# Patient Record
Sex: Female | Born: 1975 | Race: White | Hispanic: No | Marital: Married | Smoking: Never smoker
Health system: Southern US, Community
[De-identification: ages and names within clinical notes are randomized; demographics above are authoritative.]

## PROBLEM LIST (undated history)

## (undated) DIAGNOSIS — N200 Calculus of kidney: Secondary | ICD-10-CM

## (undated) HISTORY — PX: WISDOM TOOTH EXTRACTION: SHX21

---

## 2000-09-13 ENCOUNTER — Encounter: Payer: Self-pay | Admitting: Internal Medicine

## 2000-09-13 ENCOUNTER — Emergency Department (HOSPITAL_COMMUNITY): Admission: EM | Admit: 2000-09-13 | Discharge: 2000-09-13 | Payer: Self-pay | Admitting: Internal Medicine

## 2004-06-02 ENCOUNTER — Other Ambulatory Visit: Admission: RE | Admit: 2004-06-02 | Discharge: 2004-06-02 | Payer: Self-pay | Admitting: Obstetrics and Gynecology

## 2004-08-20 ENCOUNTER — Inpatient Hospital Stay (HOSPITAL_COMMUNITY): Admission: AD | Admit: 2004-08-20 | Discharge: 2004-08-20 | Payer: Self-pay | Admitting: Obstetrics and Gynecology

## 2004-09-16 ENCOUNTER — Ambulatory Visit (HOSPITAL_COMMUNITY): Admission: RE | Admit: 2004-09-16 | Discharge: 2004-09-16 | Payer: Self-pay | Admitting: Obstetrics and Gynecology

## 2004-11-04 ENCOUNTER — Inpatient Hospital Stay (HOSPITAL_COMMUNITY): Admission: AD | Admit: 2004-11-04 | Discharge: 2004-11-04 | Payer: Self-pay | Admitting: Obstetrics and Gynecology

## 2005-01-05 ENCOUNTER — Inpatient Hospital Stay (HOSPITAL_COMMUNITY): Admission: AD | Admit: 2005-01-05 | Discharge: 2005-01-05 | Payer: Self-pay | Admitting: Obstetrics and Gynecology

## 2005-01-21 ENCOUNTER — Inpatient Hospital Stay (HOSPITAL_COMMUNITY): Admission: AD | Admit: 2005-01-21 | Discharge: 2005-01-24 | Payer: Self-pay | Admitting: Obstetrics and Gynecology

## 2005-08-04 ENCOUNTER — Encounter: Admission: RE | Admit: 2005-08-04 | Discharge: 2005-08-04 | Payer: Self-pay | Admitting: Sports Medicine

## 2006-04-18 IMAGING — US US OB COMP +14 WK
1 series · 13 of 28 positions shown · non-contrast
Comparison: none

CLINICAL DATA: 21 week 2 day assigned gestational age by prior office ultrasound.  Evaluate fetal anatomy and growth.

[Series 1: us ob comp +14 wk · 0.36mm/px · 13 of 85 slices shown]
[im 4/85]
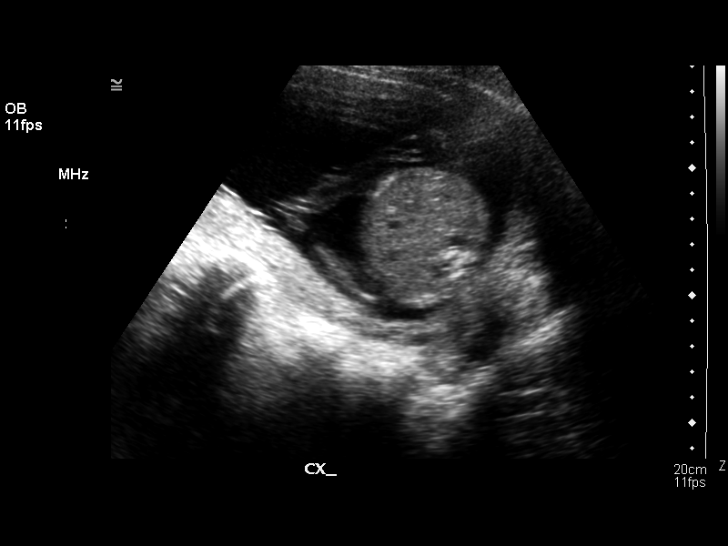
[im 10/85]
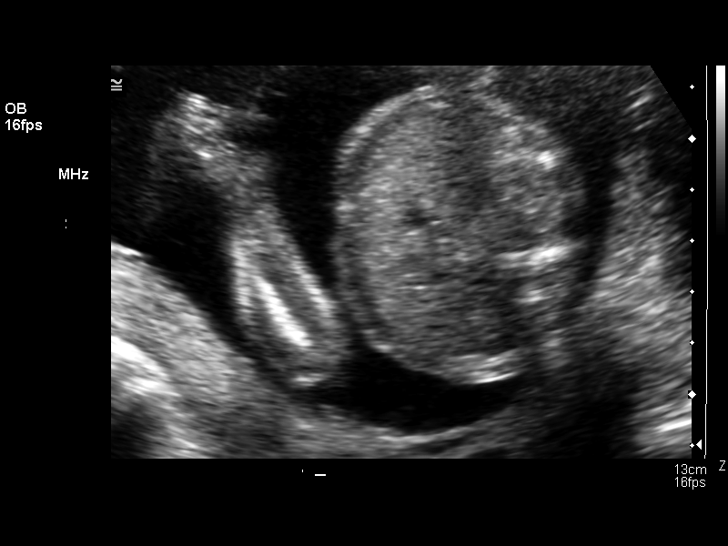
[im 16/85]
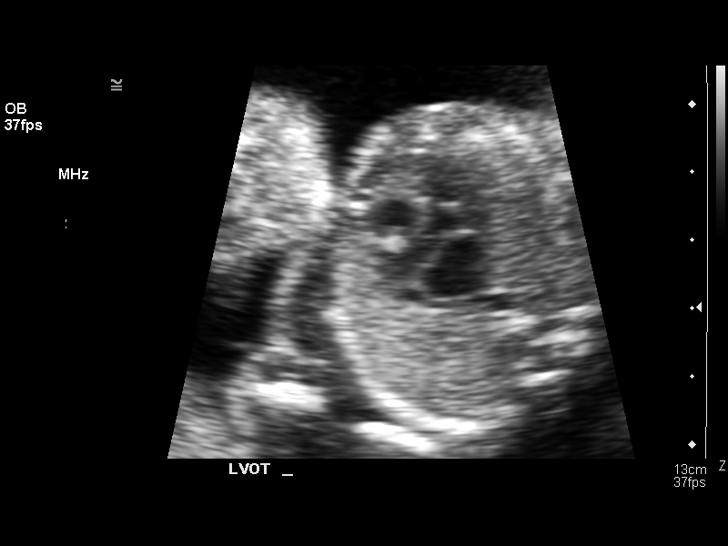
[im 22/85]
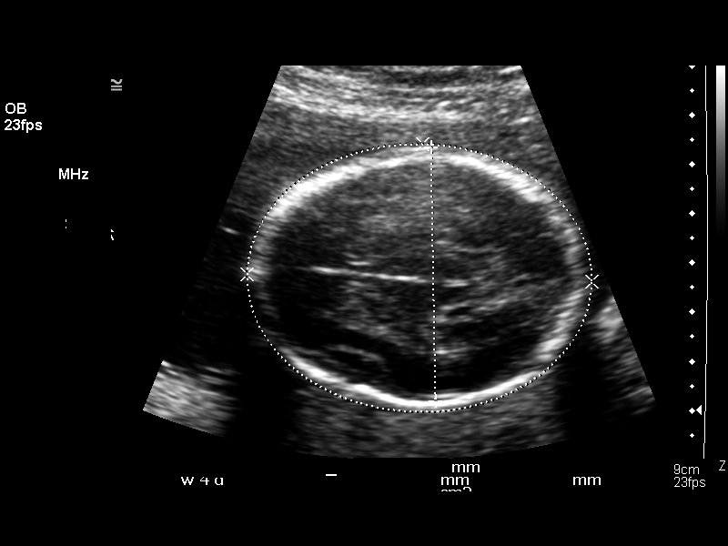
[im 29/85]
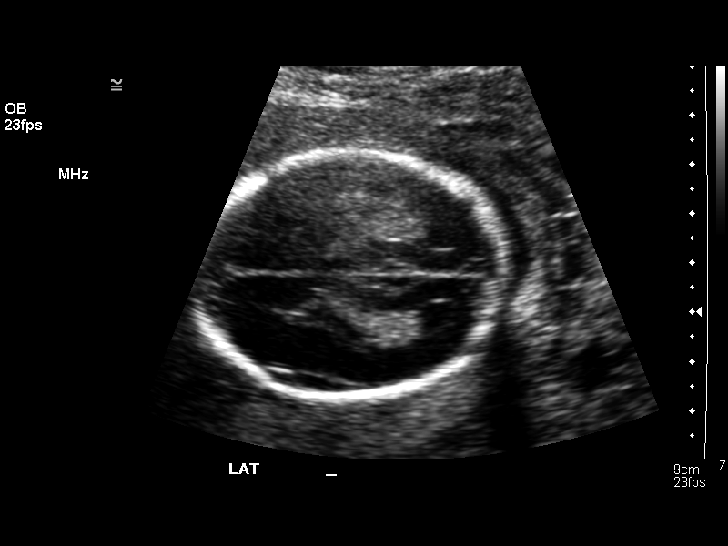
[im 35/85]
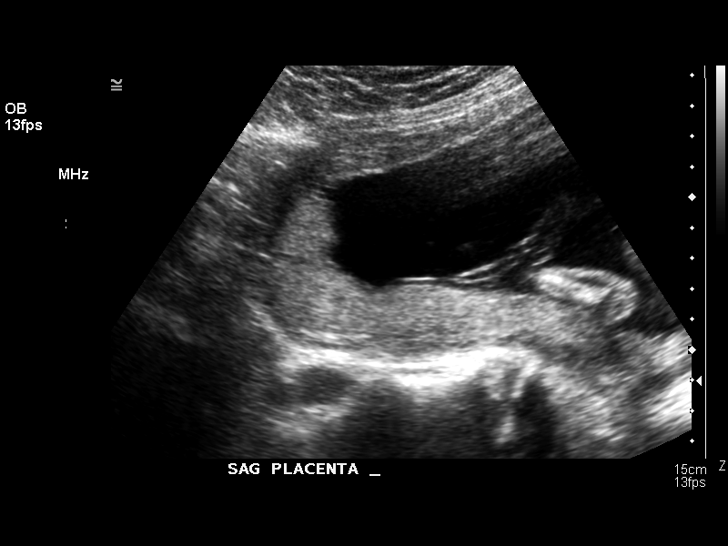
[im 44/85]
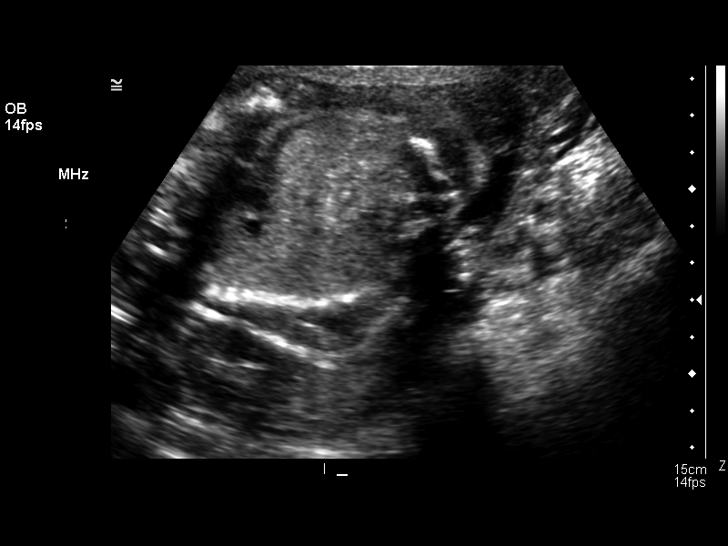
[im 50/85]
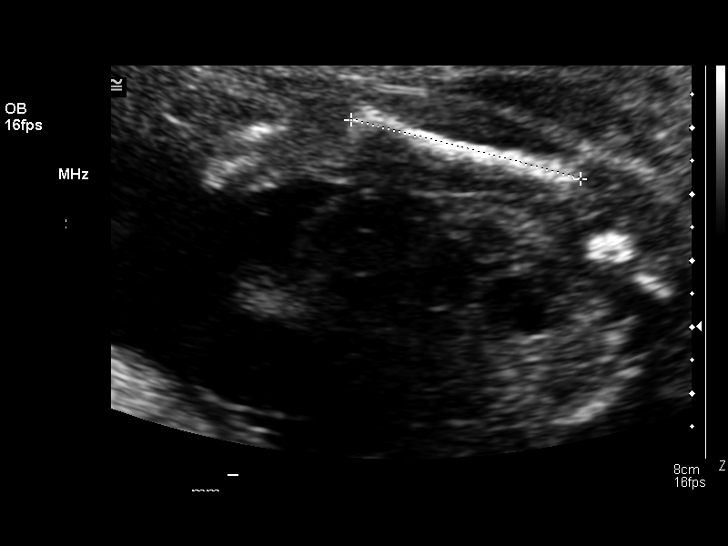
[im 57/85]
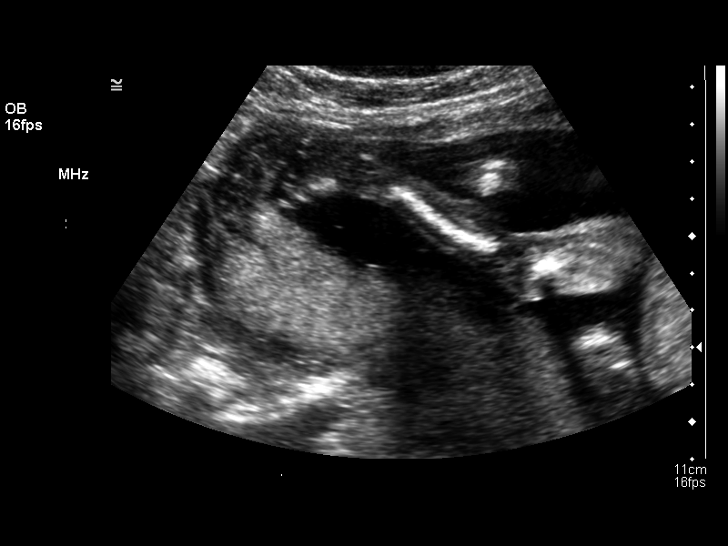
[im 63/85]
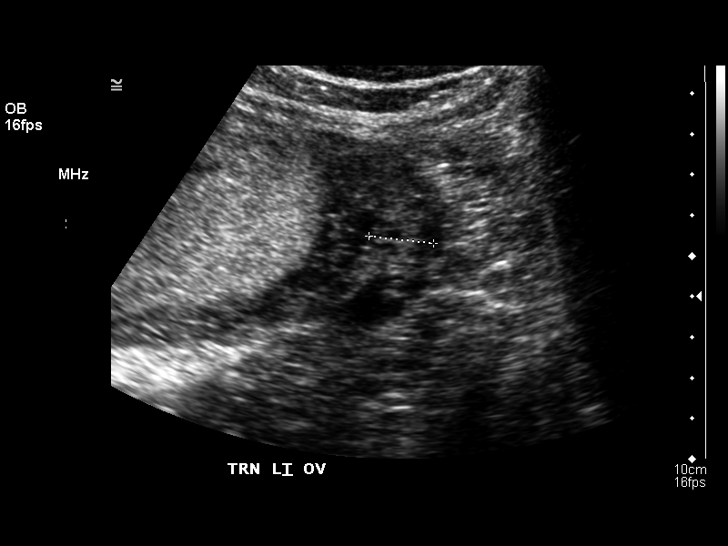
[im 69/85]
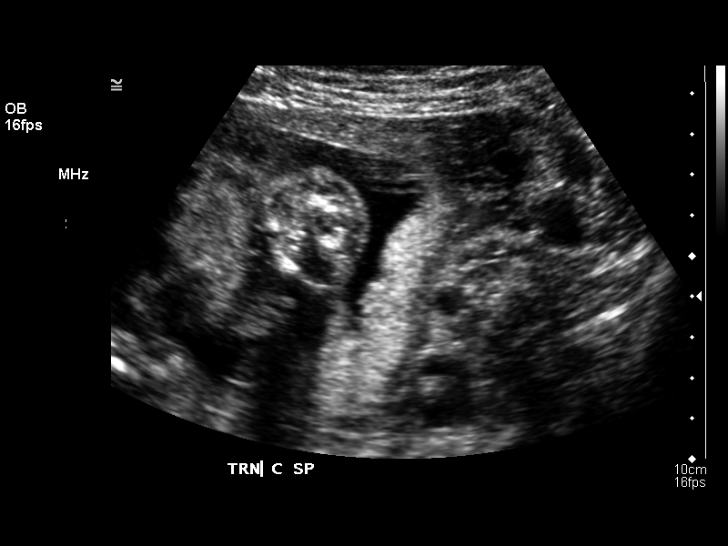
[im 75/85]
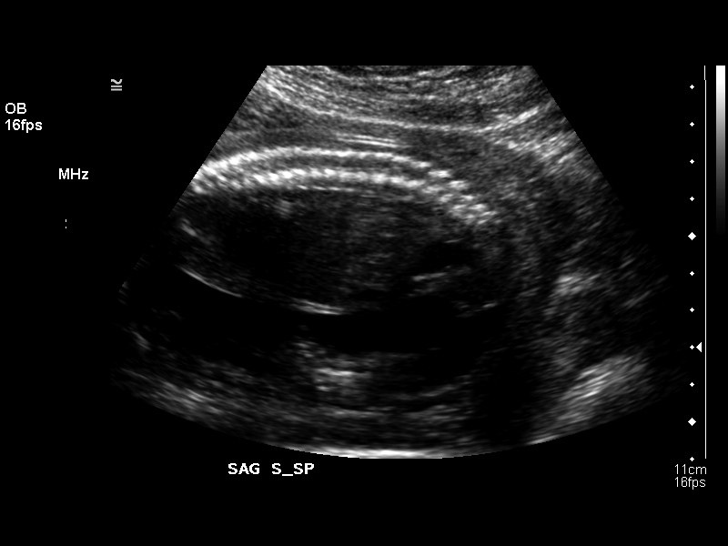
[im 81/85]
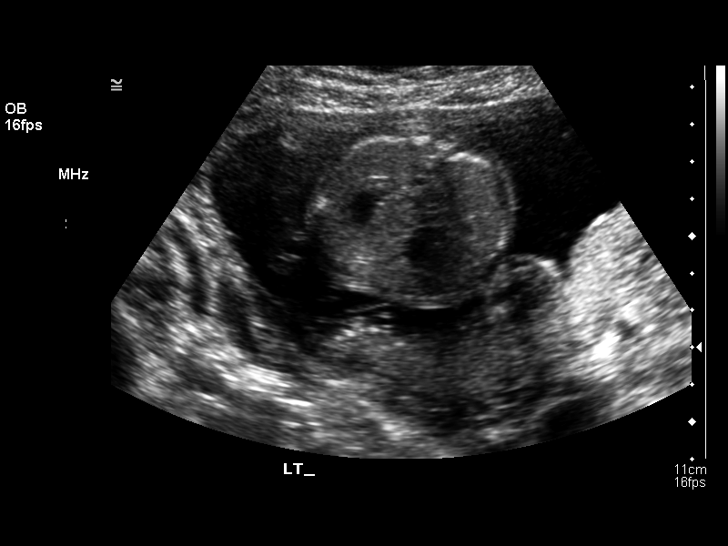

[13 of 28 positions shown; findings below may reference images not displayed]

OBSTETRICAL ULTRASOUND:
 Number of Fetuses:  1
 Heart Rate:  171
 Movement:  Yes
 Breathing:  No  
 Presentation:  Breech
 Placental Location:  Posterior
 Grade:  I
 Previa:  No
 Amniotic Fluid (Subjective):  Normal
 Amniotic Fluid (Objective):   4.3 cm Vertical pocket 

 FETAL BIOMETRY
 BPD:   5.1 cm  21 w 3 d
 HC:   19.5 cm   21 w 5 d
 AC:   17.4 cm   22 w 2 d 
 FL:    3.6 cm  21 w 2 d

 MEAN GA:  21 w 5 d

 FETAL ANATOMY
 Lateral Ventricles:    Visualized   
 Thalami/CSP:      Visualized 
 Posterior Fossa:  Visualized 
 Nuchal Region:    Visualized 
 Spine:      Visualized 
 4 Chamber Heart on Left:      Visualized 
 Stomach on Left:      Visualized 
 3 Vessel Cord:    Visualized 
 Cord Insertion site:    Visualized 
 Kidneys:  Visualized 
 Bladder:  Visualized 
 Extremities:      Visualized 

 ADDITIONAL ANATOMY VISUALIZED:  LVOT, RVOT, upper lip, orbits, diaphragm, heel, 5th digit, ductal arch, aortic arch, and female genitalia.
 Comments:  An echogenic intracardiac focus is noted, which is considered a soft sonographic marker for Down syndrome, although it is seen in up to 5% of normal fetuses.  A nasal bone is visualized, and no other sonographic markers for aneuploidy are identified.  

 MATERNAL UTERINE AND ADNEXAL FINDINGS
 Cervix:   3.0 cm Transabdominally
 Both ovaries are visualized and are unremarkable in appearance.
IMPRESSION: 1.  Assigned gestational age by prior office ultrasound is currently 21 weeks 2 days.  Appropriate fetal growth.  
 2.  No evidence of fetal anomalies.  An echogenic intracardiac focus is noted which results in mild increase in patient?s age based risk for Down syndrome to [DATE].  Correlation with results of maternal serum screening is recommended if available.  

 </u12:p>

## 2006-04-30 ENCOUNTER — Emergency Department (HOSPITAL_COMMUNITY): Admission: EM | Admit: 2006-04-30 | Discharge: 2006-04-30 | Payer: Self-pay | Admitting: Emergency Medicine

## 2006-08-07 IMAGING — US US OB LIMITED
1 series · 13 of 28 positions shown · non-contrast
Comparison: none

CLINICAL DATA: 38 weeks estimated gestational age status post fall at work.

[Series 1: us ob limited · 0.45mm/px · 13 of 29 slices shown]
[im 2/29]
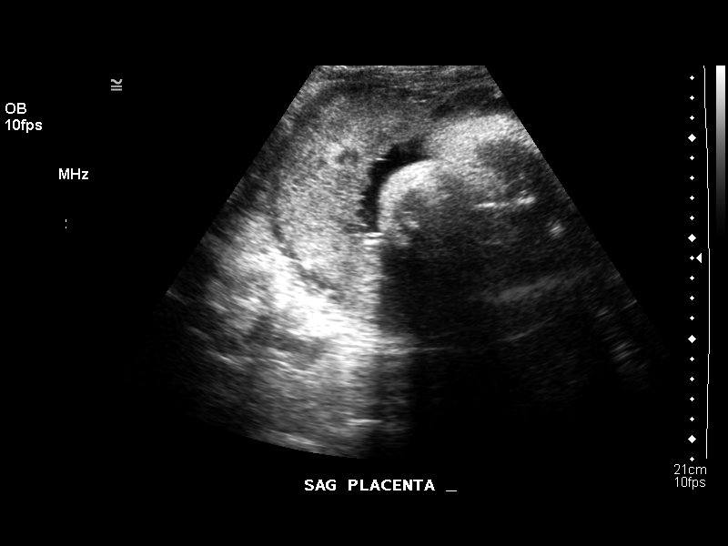
[im 4/29]
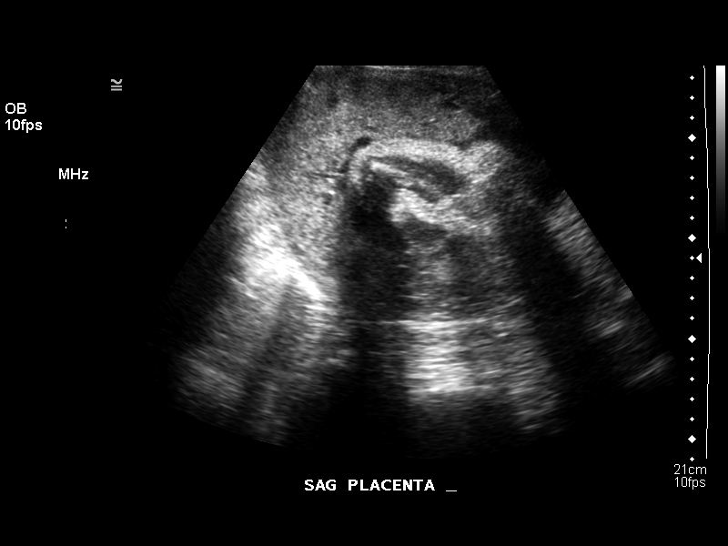
[im 6/29]
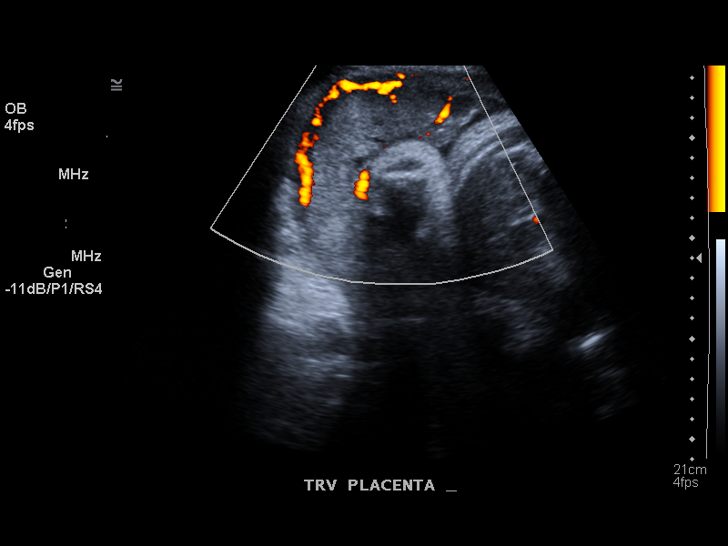
[im 8/29]
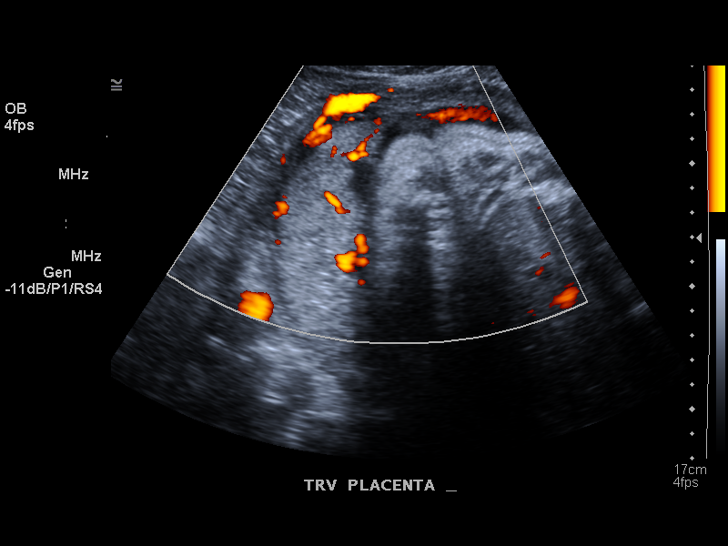
[im 10/29]
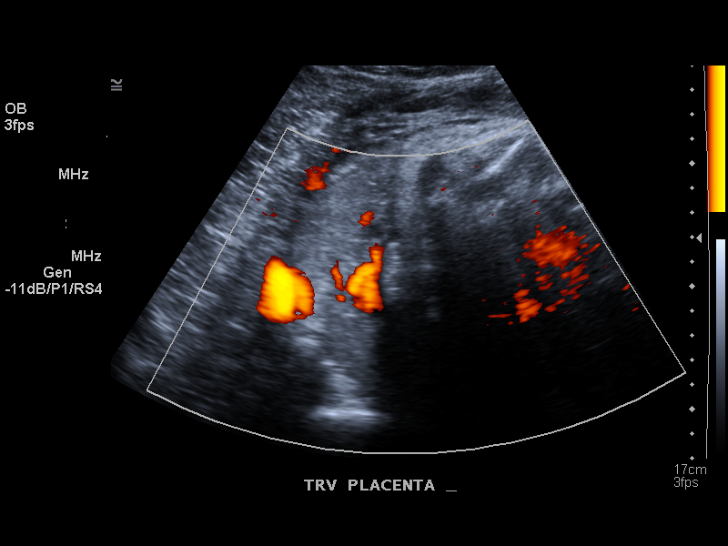
[im 12/29]
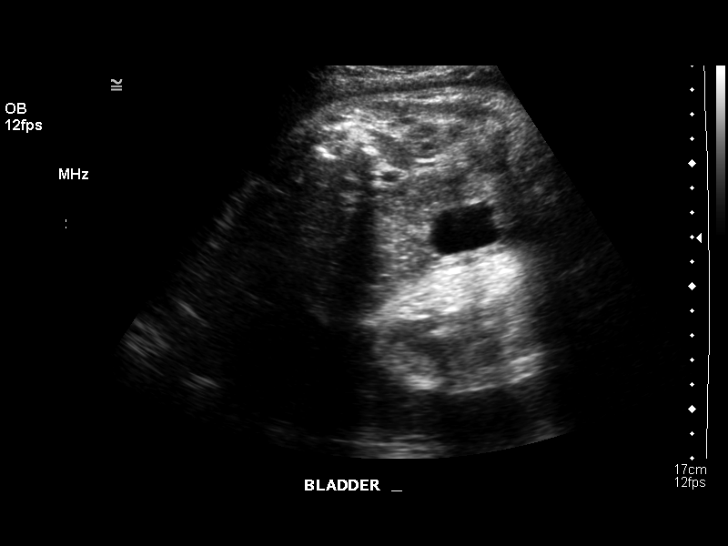
[im 15/29]
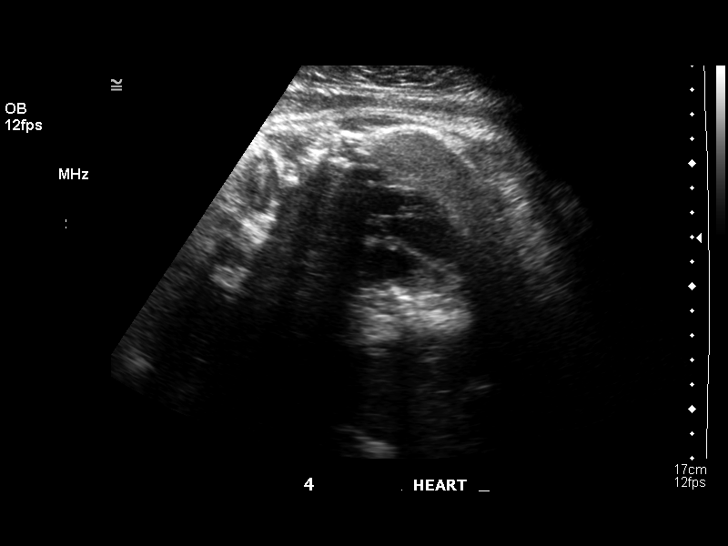
[im 17/29]
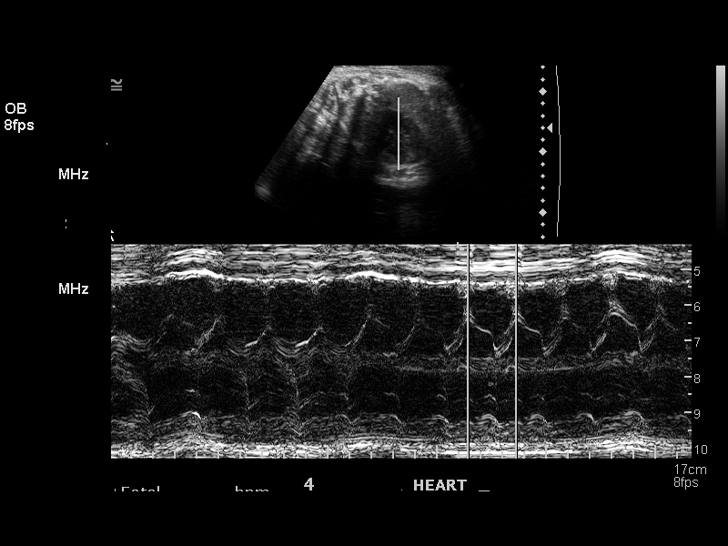
[im 19/29]
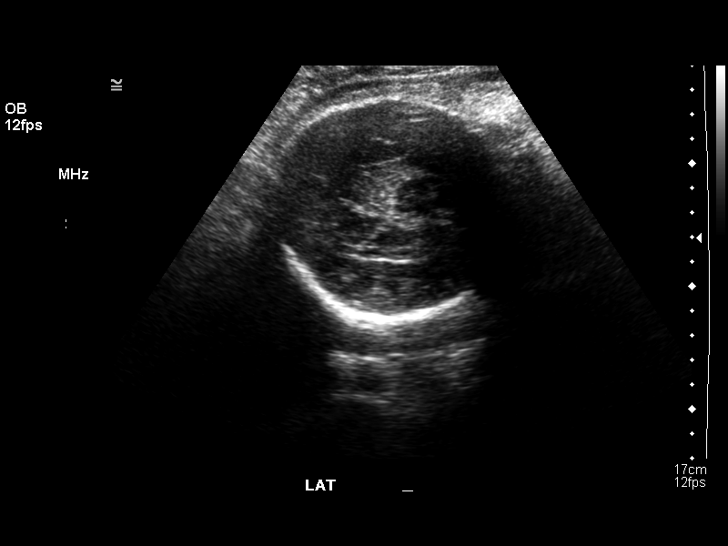
[im 21/29]
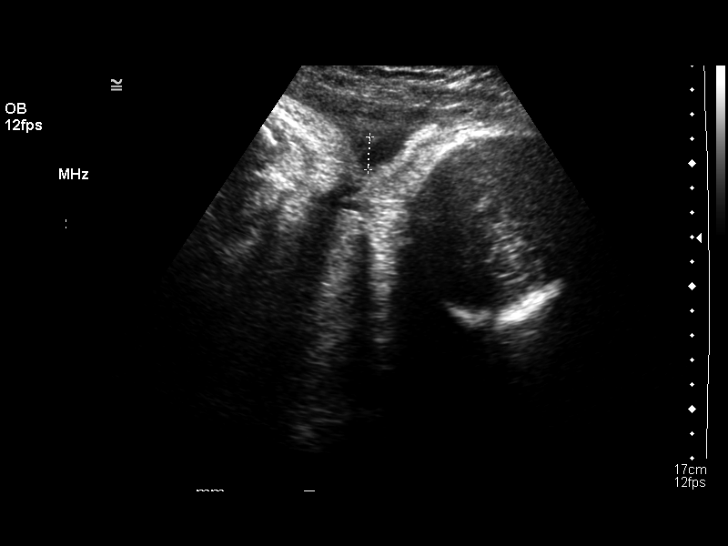
[im 23/29]
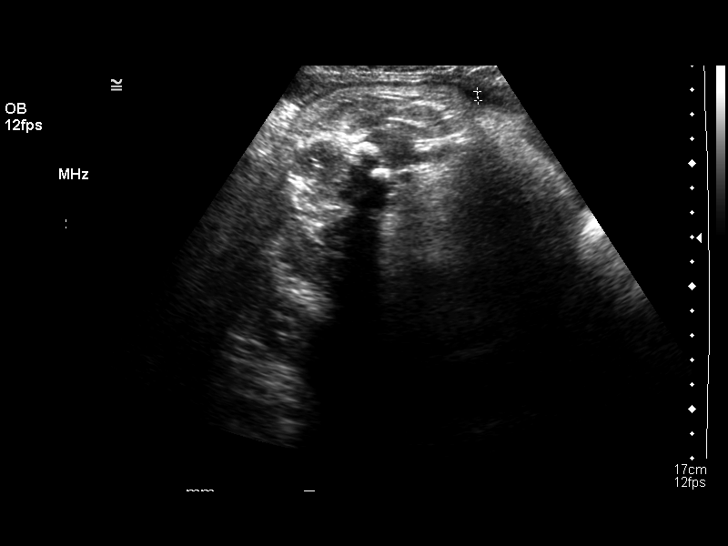
[im 25/29]
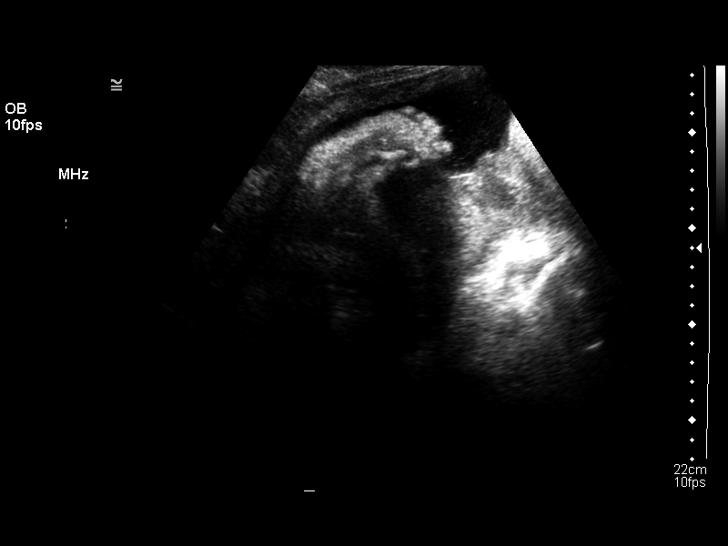
[im 27/29]
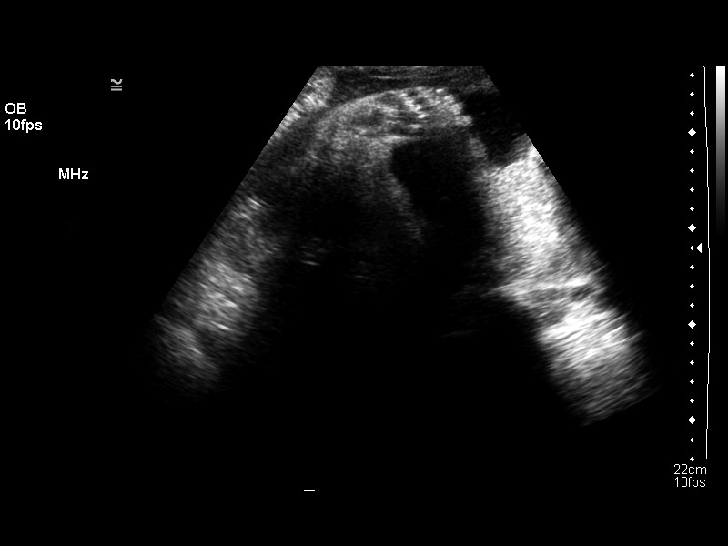

[13 of 28 positions shown; findings below may reference images not displayed]

LIMITED OBSTETRICAL ULTRASOUND:
Number of Fetuses:  1
Heart Rate:  136
Movement:  Yes
Breathing:  Yes
Presentation:  Cephalic
Placental Location:  Posterior, right lateral
Grade:  I
Previa:  No
Comments:  No evidence for retroplacental, preplacental, or marginal hemorrhage is seen.  A very small posterior portion of the placenta is shadowed by fetal parts.
Amniotic Fluid (Subjective):  Normal
Amniotic Fluid (Objective):  9.6 cm AFI (5th -95th%ile = 7.5 ? 24.4 cm for 37 wks)

Fetal measurements and complete anatomic evaluation were not requested.  The following fetal anatomy was visualized during this exam:  Lateral ventricles, four chamber heart, stomach, kidneys, bladder, diaphragm and female genitalia.

MATERNAL UTERINE AND ADNEXAL FINDINGS
Cervix: Not evaluated.
IMPRESSION: 1.  No sonographic evidence for abruption is seen in this patient with a history of recent trauma.  A small portion of the placenta positioned posteriorly is obscured by overlying fetal parts and incompletely evaluated.  
2.  Subjectively and quantitatively normal amniotic fluid volume.
3.  No late developing fetal anatomic abnormalities are identified associated with the lateral ventricles, four chamber heart, stomach, kidneys or bladder.

## 2007-11-30 IMAGING — CR DG CHEST 2V
2 series · 2 of 2 positions shown · non-contrast
Comparison: None.

CLINICAL DATA: 30 year-old-female with syncope.
 CHEST - 2 VIEW:

[view not recorded (1 of 2)]
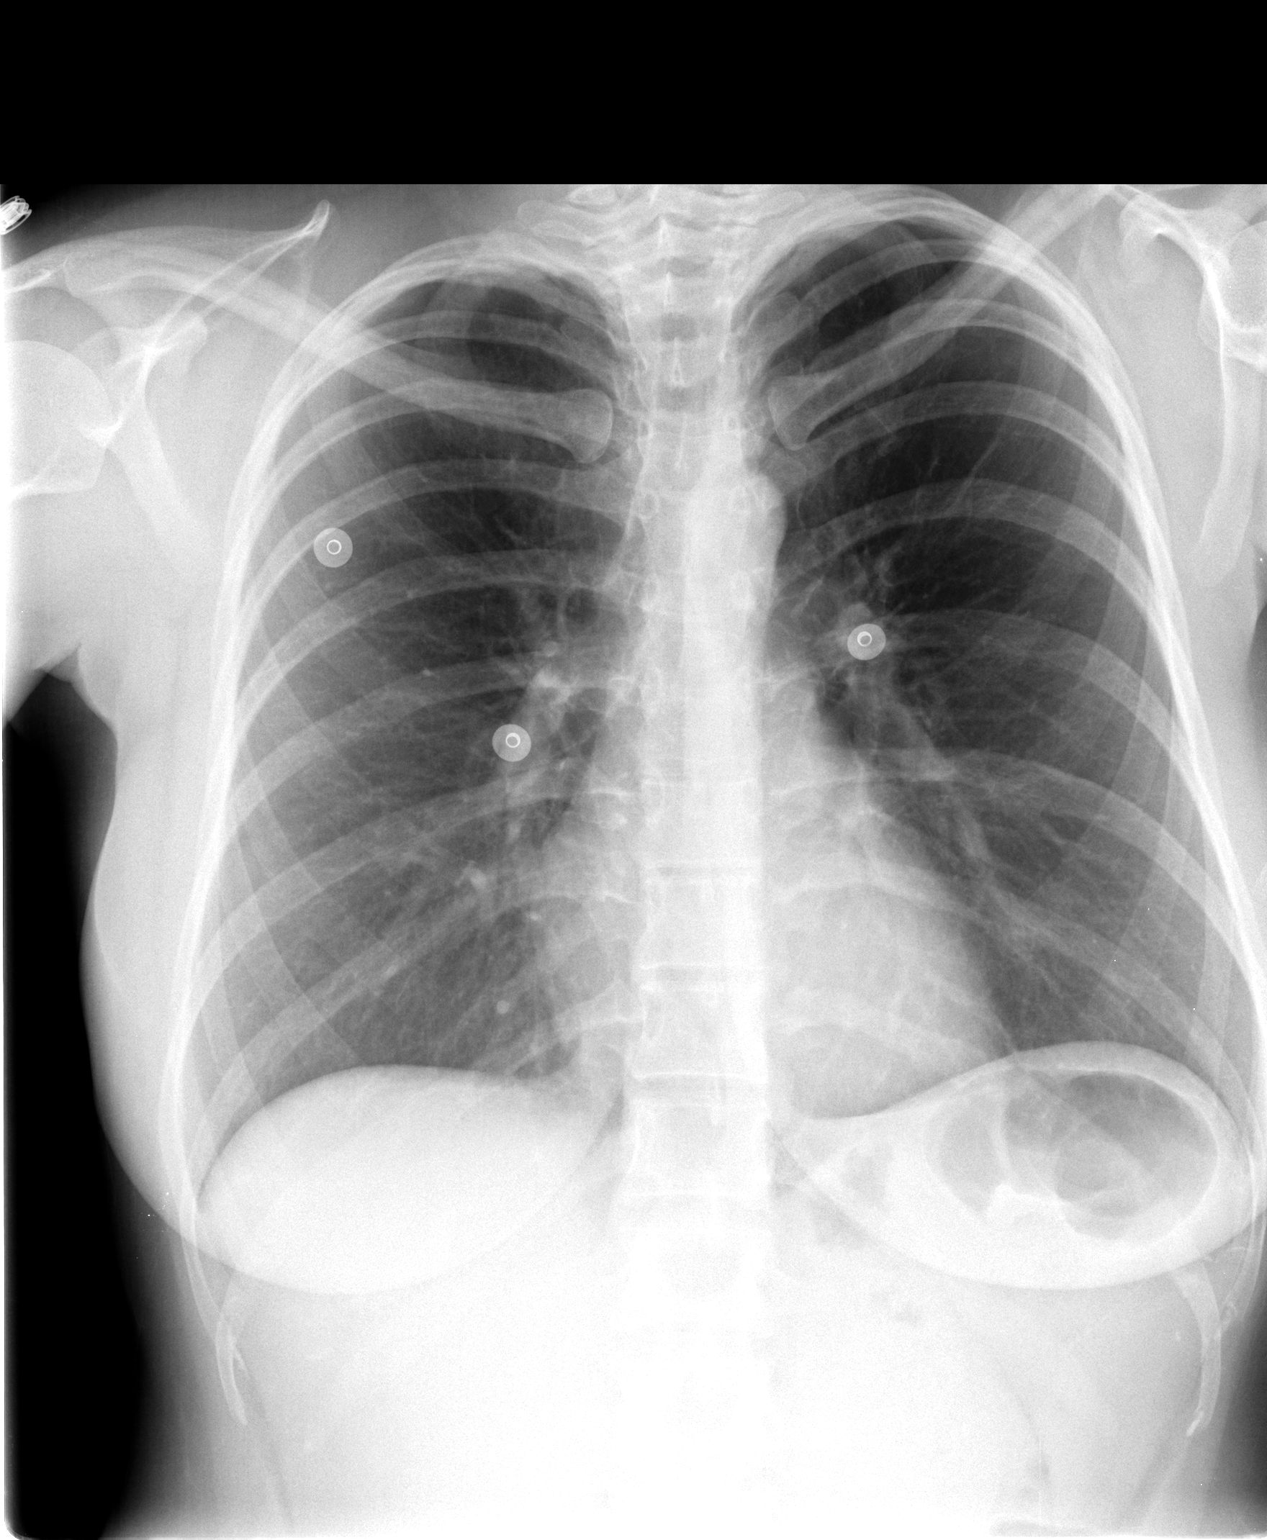

[view not recorded (2 of 2)]
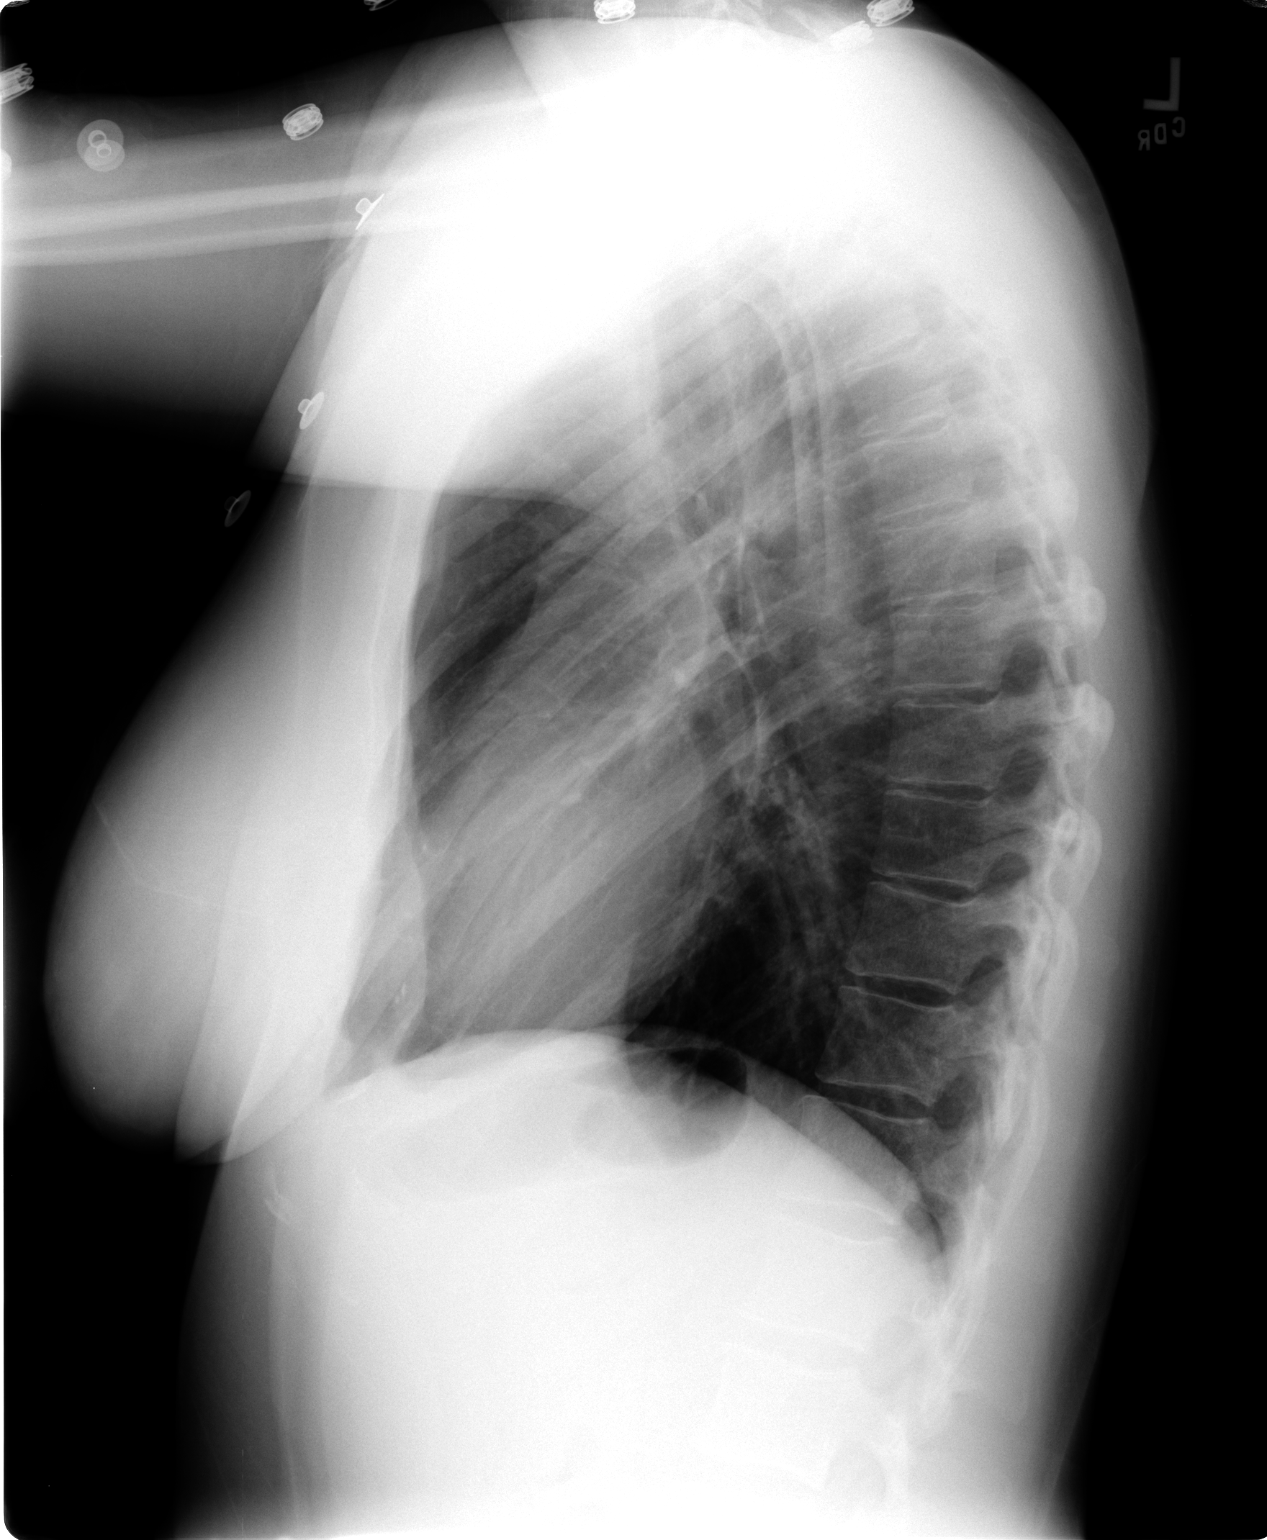

[2 of 2 positions shown; findings below may reference images not displayed]

FINDINGS: Two views of the chest are obtained.    The entire left costophrenic angle was not imaged.   The heart and mediastinum are within normal limits.  The trachea is midline.  No acute bone abnormalities.   Negative for edema or pleural effusions.
IMPRESSION: No acute chest findings.

## 2010-04-04 ENCOUNTER — Inpatient Hospital Stay (HOSPITAL_COMMUNITY)
Admission: AD | Admit: 2010-04-04 | Discharge: 2010-04-04 | Payer: Self-pay | Source: Home / Self Care | Attending: Obstetrics & Gynecology | Admitting: Obstetrics & Gynecology

## 2010-05-11 ENCOUNTER — Encounter: Payer: Self-pay | Admitting: Obstetrics and Gynecology

## 2010-06-30 LAB — POCT PREGNANCY, URINE: Preg Test, Ur: NEGATIVE

## 2010-06-30 LAB — CBC
MCH: 29.8 pg (ref 26.0–34.0)
MCHC: 33.9 g/dL (ref 30.0–36.0)
RBC: 4 MIL/uL (ref 3.87–5.11)

## 2010-09-05 NOTE — Op Note (Signed)
NAMEBERLINDA, Michelle Shields              ACCOUNT NO.:  [id number]   MEDICAL RECORD NO.:  [id number]          PATIENT TYPE:  INP   LOCATION:  9171                          FACILITY:  [facility]   PHYSICIAN:  Lenoard Aden, M.D.DATE OF BIRTH:  1975-05-13   DATE OF PROCEDURE:  01/22/2005  DATE OF DISCHARGE:                                 OPERATIVE REPORT   OPERATIVE DELIVERY NOTE:   CHIEF COMPLAINT:  Persistent fetal tachycardia.   POSTOPERATIVE DIAGNOSIS:  Persistent fetal tachycardia.   PROCEDURE:  Outlet vacuum-assisted vaginal delivery with Kiwi cup.   SURGEON:  Lenoard Aden, M.D.   ANESTHESIA:  Epidural.   ESTIMATED BLOOD LOSS:  500 mL.   COMPLICATIONS:  None.   DRAINS:  None.   COUNTS:  Correct.   Patient to recovery room in good condition.   DESCRIPTION:  After apprising the patient of the risks of vacuum assistance,  including cephalohematoma, small risk of intracranial hemorrhage, scalp  laceration, fetal vertex +3, +4 station, fetal heart tones in the 180s  persistently.  Thus, the Kiwi cup is placed in occiput anterior position,  less than 45 degrees, for two pulls.  A full-term living female over a  midline second degree laceration, Apgars 8 and 9, cord blood collected.  The  placenta delivered spontaneously intact, three-vessel cord noted.  No  cervical lacerations.  Bulb suctioning is performed, bimanual massage is  used therapeutically to reduce uterine atony, which is successful.  There is  repair of the second degree laceration with 2-0 Rapide.  There are no other  vaginal or cervical lacerations noted.  Mother and baby are recovering in  good condition.      Lenoard Aden, M.D.  Electronically Signed     RJT/MEDQ  D:  01/22/2005  T:  01/22/2005  Job:  [id number]

## 2010-09-05 NOTE — Consult Note (Signed)
NAMEMEDRITH, VEILLON              ACCOUNT NO.:  [id number]   MEDICAL RECORD NO.:  [id number]          PATIENT TYPE:  MAT   LOCATION:  [facility]                          [facility]ACILITY:  [facility]   PHYSICIAN:  Lenoard Aden, M.D.DATE OF BIRTH:  1975-09-28   DATE OF CONSULTATION:  01/05/2005  DATE OF DISCHARGE:                                   CONSULTATION   CHIEF COMPLAINT:  Pregnancy-related trauma.   HISTORY OF PRESENT ILLNESS:  She is a 35 year old white female, G2, P6, EDD  of January 26, 2005, at [redacted] weeks gestation with an atraumatic fall on her  hands and knees today while walking, denies any direct abdominal trauma.  She denies any cramping or vaginal bleeding.  She has no known drug  allergies.  Medications are prenatal vitamins.  She had a spontaneous  vaginal delivery of an 8 pounds 6 ounce female in 1998.  She has no other  medical or surgical hospitalizations.   Family history of heart disease, hypertension, and breast cancer.  She is a  nonsmoker, nondrinker, denies domestic or physical violence.   PRENATAL LAB DATA:  Blood type of A positive, hepatitis/HIV negative, GC and  Chlamydia negative.  GBS was negative.   PHYSICAL EXAMINATION:  GENERAL:  She is a well-developed, well-nourished  white female, no acute distress.  HEENT:  Normal.  LUNGS:  Clear.  HEART:  Regular rhythm.  ABDOMEN:  Soft, gravid, and nontender.  CERVICAL EXAM:  Deferred.  EXTREMITIES:  No cords.  NEUROLOGIC:  Nonfocal.   IMPRESSION:  1.  A 37 week OB.  2.  Pregnancy-related trauma with nondirect trauma to the abdomen.   PLAN:  Prolonged monitoring, ultrasound, anticipate discharge home.  We will  rule out labor and remote possibility of abruption.      Lenoard Aden, M.D.  Electronically Signed     RJT/MEDQ  D:  01/05/2005  T:  01/05/2005  Job:  [id number]

## 2010-09-05 NOTE — H&P (Signed)
NAMEJHORDAN, Michelle Shields              ACCOUNT NO.:  [id number]   MEDICAL RECORD NO.:  [id number]          PATIENT TYPE:  INP   LOCATION:  9171                          [facility]ACILITY:  [facility]   PHYSICIAN:  Lenoard Aden, M.D.DATE OF BIRTH:  [birth year]   DATE OF ADMISSION:  01/21/2005  DATE OF DISCHARGE:                                HISTORY & PHYSICAL   CHIEF COMPLAINT:  Oligohydramnios.   Patient is a 35 year old white female, G2, P1, at 39+ weeks with AFI of 8,  with history of ___________ complicated.   MEDICATIONS:  Prenatal vitamins.   ____________   PAST MEDICAL HISTORY:  Breast cancer, hypertension, heart disease, and  diabetes.   PRENATAL LABORATORY DATA:  Reveals a ___________ positive.  Rh antibody  negative.  ___________ amniotic fluid index less than ___________ .   PHYSICAL EXAMINATION:  GENERAL:  She is a well-developed, well-nourished,  white female in no acute distress.  HEENT:  Normal.  LUNGS:  Clear.  HEART:  Regular rhythm.  ABDOMEN:  Soft, gravid, nontender.  Estimated fetal weight of approximately  8 pounds.  PELVIC:  Cervix is 2-to-3-cm, __________ vertex, -1.  EXTREMITIES:  With no cords. ___________.   IMPRESSION:  1.  39+ week OB.  2.  Oligohydramnios, for induction.   PLAN:  Proceed with induction. This ___________ was discussed.  Epidural as  needed.      Lenoard Aden, M.D.  Electronically Signed     RJT/MEDQ  D:  01/21/2005  T:  01/21/2005  Job:  [id number]

## 2011-11-04 IMAGING — US US PELVIS COMPLETE MODIFY
1 series · 13 of 25 positions shown · non-contrast
Comparison: Prior ultrasound of pregnancy performed 01/05/2005

CLINICAL DATA: Excessive vaginal bleeding.



[Series 1: us transvaginal non-ob · 13 of 44 slices shown]
[im 1/44]
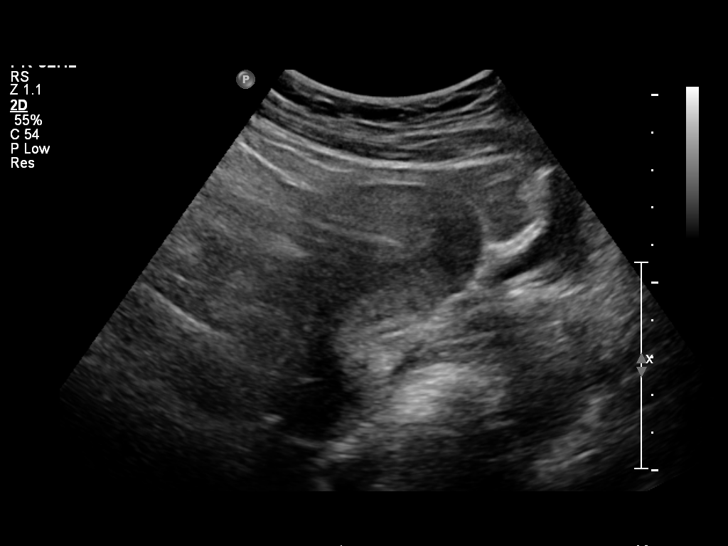
[im 4/44]
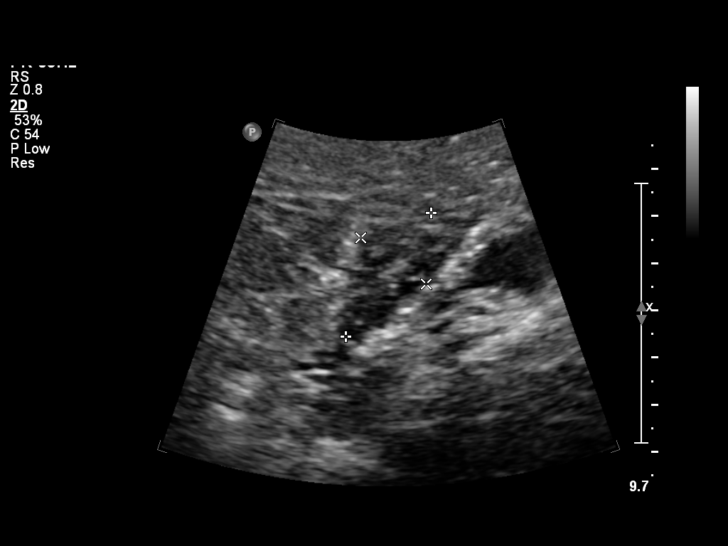
[im 8/44]
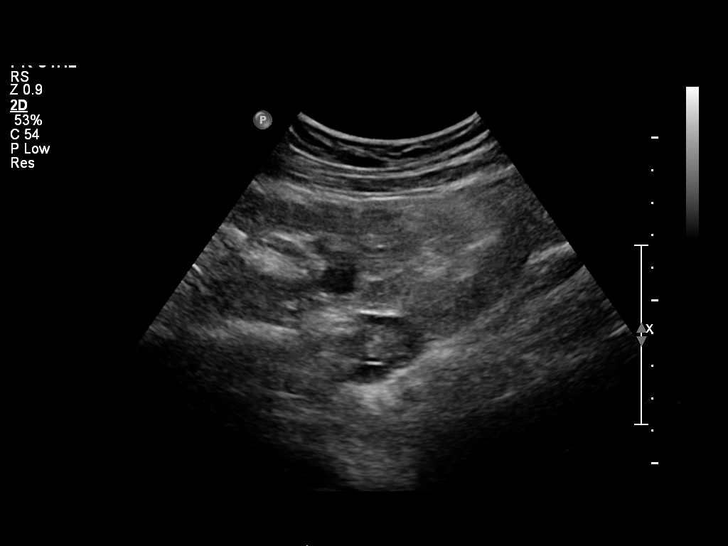
[im 11/44]
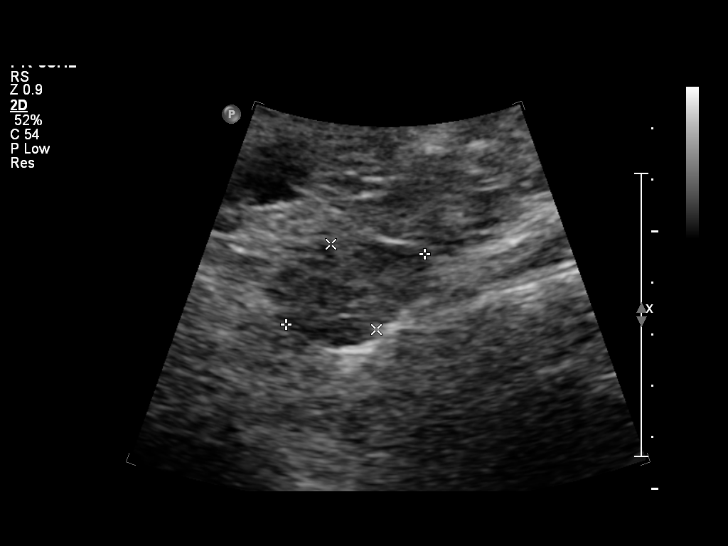
[im 15/44]
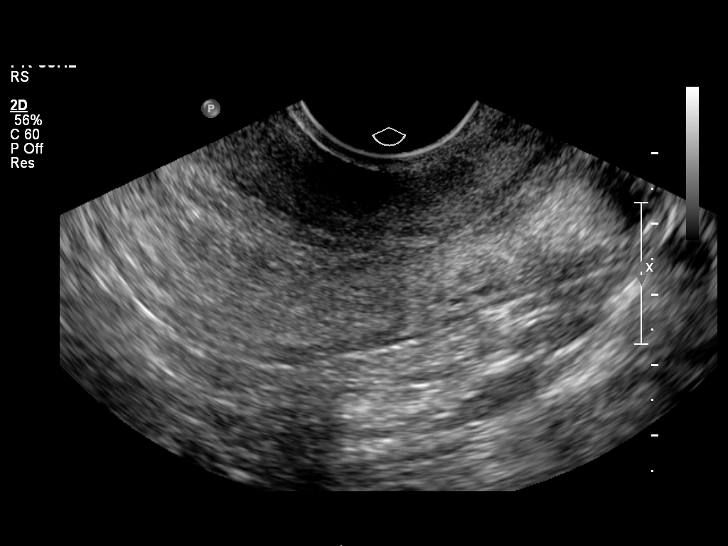
[im 18/44]
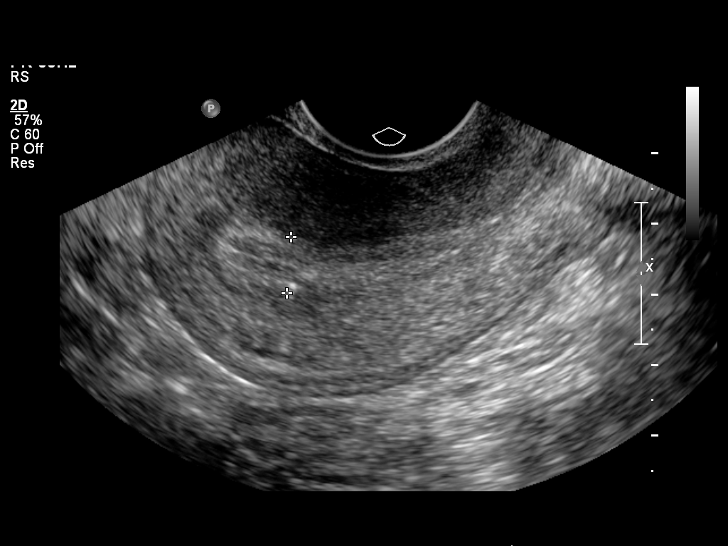
[im 22/44]
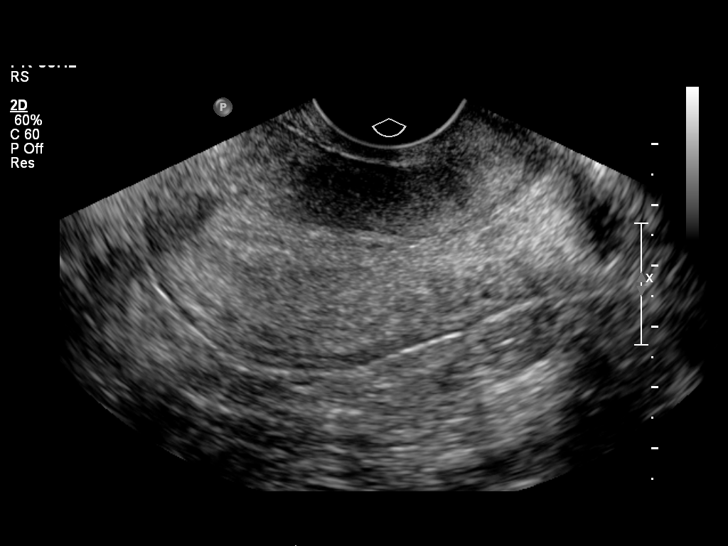
[im 26/44]
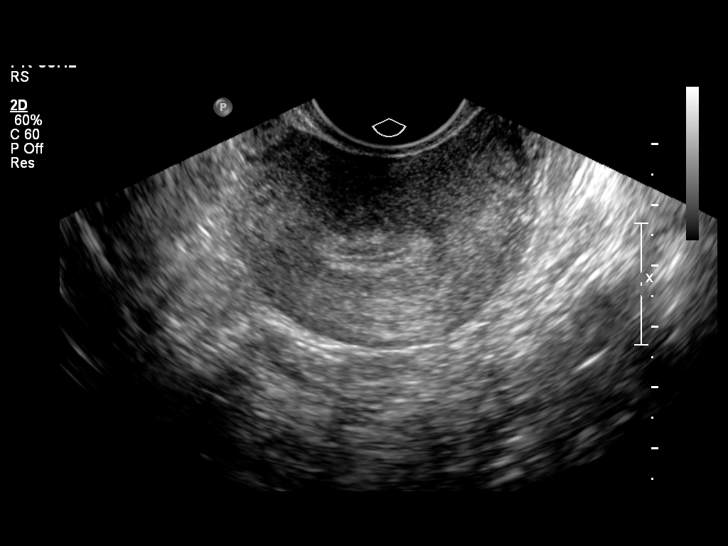
[im 29/44]
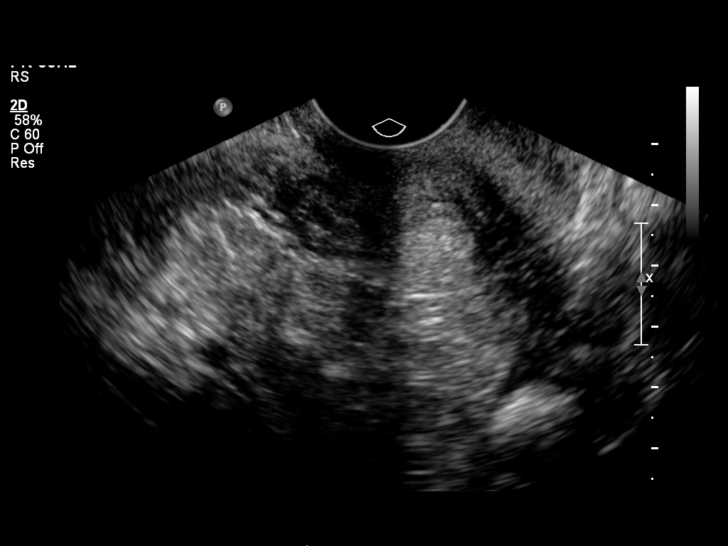
[im 33/44]
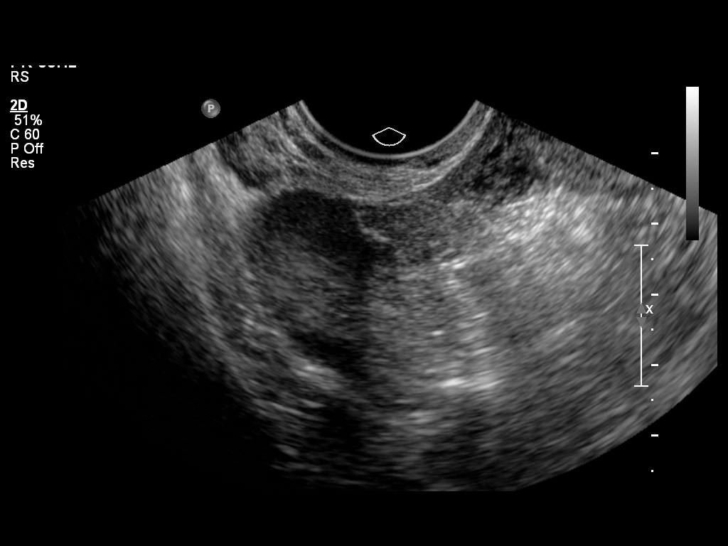
[im 36/44]
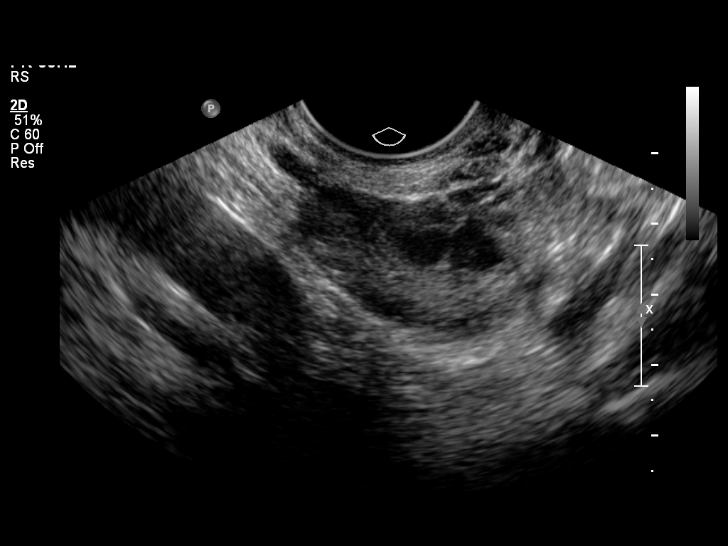
[im 40/44]
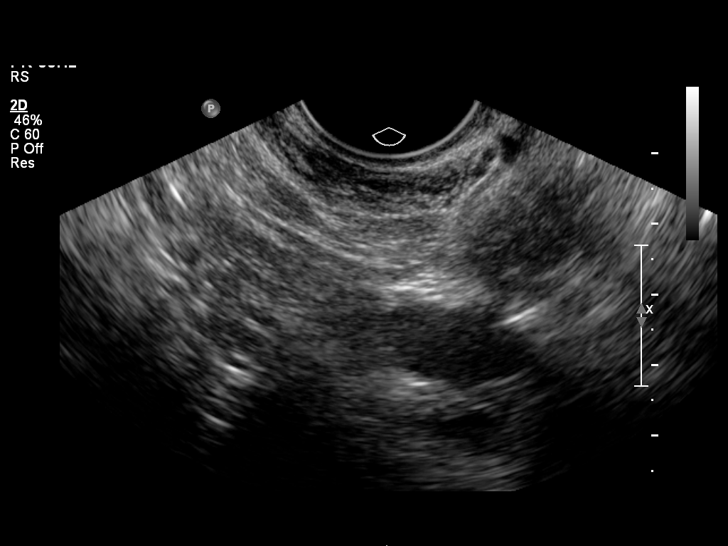
[im 44/44]
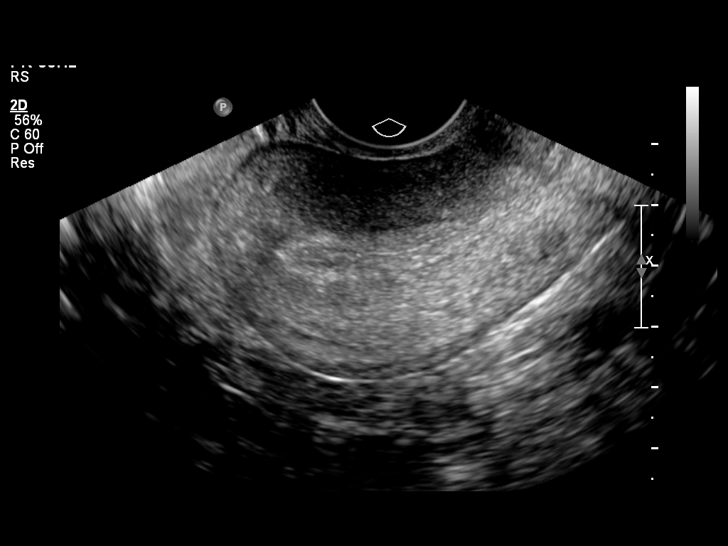

[13 of 25 positions shown; findings below may reference images not displayed]

FINDINGS: The uterus is unremarkable in appearance.  It measures 7.3 cm in
length, 3.9 cm in AP dimension and 5.0 cm in transverse dimension.
The endometrial echo complex is within normal limits, measuring
cm in thickness.  No definite fluid is seen within the endometrial
canal.  No fibroids are identified.  Myometrial echogenicity is
within normal limits.

The ovaries are unremarkable in appearance.  The right ovary
measures 3.3 x 1.8 x 1.9 cm, while the left ovary measures 3.2 x
1.7 x 1.6 cm.  No suspicious adnexal masses are seen.  There is no
definite evidence for ovarian torsion.

No free fluid is seen within the pelvic cul-de-sac.
IMPRESSION: Unremarkable pelvic ultrasound.

## 2014-06-25 ENCOUNTER — Encounter (HOSPITAL_COMMUNITY): Payer: Self-pay | Admitting: General Practice

## 2014-06-25 DIAGNOSIS — N840 Polyp of corpus uteri: Secondary | ICD-10-CM | POA: Diagnosis present

## 2014-06-25 DIAGNOSIS — Z79899 Other long term (current) drug therapy: Secondary | ICD-10-CM | POA: Diagnosis not present

## 2014-06-27 ENCOUNTER — Other Ambulatory Visit: Payer: Self-pay | Admitting: Obstetrics and Gynecology

## 2014-07-02 NOTE — H&P (Signed)
NAMLadean Shields:  Shields, Michelle              ACCOUNT NO.:  [id number]  MEDICAL RECORD NO.:  [id number]  LOCATION:  [facility]                         [facility]ACILITY:  [facility]  PHYSICIAN:  Lenoard Adenichard J. [name], M.D.DATE OF BIRTH:  Sep 15, 1975  DATE OF ADMISSION:  06/21/2014 DATE OF DISCHARGE:                             HISTORY & PHYSICAL   CHIEF COMPLAINT:  Menometrorrhagia.  HISTORY OF PRESENT ILLNESS:  The patient is a 39 year old white female, G2, P2, who presents with menometrorrhagia for surgical therapy.  ALLERGIES:  She has no known drug allergies.  MEDICATIONS:  No medications.  FAMILY HISTORY:  Family history of kidney cancer, breast cancer, hypertension, heart disease, diabetes, and history of vaginal delivery x2.  PHYSICAL EXAM:  GENERAL:  Well-developed, well-nourished white female, in no acute distress. HEENT:  Normal. NECK:  Supple.  Full range of motion. LUNGS:  Clear. HEART:  Regular rate and rhythm. ABDOMEN:  Soft and nontender. PELVIC:  Exam reveals anteflexed uterus and no adnexal masses. EXTREMITIES:  There are no cords. NEUROLOGIC:  Nonfocal. SKIN:  Intact.  IMPRESSION:  Menometrorrhagia with structural lesion.  PLAN:  Proceed with diagnostic hysteroscopy, D and C, Versapoint resection, NovaSure endometrial ablation.  Risks of anesthesia, infection, bleeding, injury to surrounding organs, possible need for repair was discussed, delayed versus immediate complications to include bowel and bladder injury  noted.  The patient acknowledges and wishes to proceed.    Lenoard Adenichard J. Waldo Damian, M.D.    RJT/MEDQ  D:  07/02/2014  T:  07/02/2014  Job:  [id number]

## 2014-07-03 ENCOUNTER — Encounter (HOSPITAL_COMMUNITY): Payer: Self-pay | Admitting: Certified Registered Nurse Anesthetist

## 2014-07-03 ENCOUNTER — Ambulatory Visit (HOSPITAL_COMMUNITY): Payer: BLUE CROSS/BLUE SHIELD | Admitting: Anesthesiology

## 2014-07-03 ENCOUNTER — Encounter (HOSPITAL_COMMUNITY)
Admission: RE | Disposition: A | Discharge status: Home / Self Care | Payer: Self-pay | Source: Ambulatory Visit | Attending: Obstetrics and Gynecology

## 2014-07-03 ENCOUNTER — Ambulatory Visit (HOSPITAL_COMMUNITY)
Admission: RE | Admit: 2014-07-03 | Discharge: 2014-07-03 | Disposition: A | Discharge status: Home / Self Care | Payer: BLUE CROSS/BLUE SHIELD | Source: Ambulatory Visit | Attending: Obstetrics and Gynecology | Admitting: Obstetrics and Gynecology

## 2014-07-03 DIAGNOSIS — N840 Polyp of corpus uteri: Secondary | ICD-10-CM | POA: Insufficient documentation

## 2014-07-03 DIAGNOSIS — Z79899 Other long term (current) drug therapy: Secondary | ICD-10-CM | POA: Insufficient documentation

## 2014-07-03 HISTORY — DX: Calculus of kidney: N20.0

## 2014-07-03 HISTORY — PX: DILITATION & CURRETTAGE/HYSTROSCOPY WITH VERSAPOINT RESECTION: SHX5571

## 2014-07-03 HISTORY — PX: NOVASURE ABLATION: SHX5394

## 2014-07-03 LAB — CBC
HCT: 37.1 % (ref 36.0–46.0)
Hemoglobin: 12.4 g/dL (ref 12.0–15.0)
MCH: 29.3 pg (ref 26.0–34.0)
MCHC: 33.4 g/dL (ref 30.0–36.0)
MCV: 87.7 fL (ref 78.0–100.0)
Platelets: 195 10*3/uL (ref 150–400)
RBC: 4.23 MIL/uL (ref 3.87–5.11)
RDW: 13.4 % (ref 11.5–15.5)
WBC: 5.9 10*3/uL (ref 4.0–10.5)

## 2014-07-03 LAB — HCG, SERUM, QUALITATIVE: PREG SERUM: NEGATIVE

## 2014-07-03 LAB — BASIC METABOLIC PANEL
ANION GAP: 9 (ref 5–15)
BUN: 10 mg/dL (ref 6–23)
CHLORIDE: 105 mmol/L (ref 96–112)
CO2: 25 mmol/L (ref 19–32)
CREATININE: 0.58 mg/dL (ref 0.50–1.10)
Calcium: 8.8 mg/dL (ref 8.4–10.5)
GFR calc Af Amer: >90 mL/min (ref >90)
GFR calc non Af Amer: >90 mL/min (ref >90)
Glucose, Bld: 92 mg/dL (ref 70–99)
Potassium: 4.2 mmol/L (ref 3.5–5.1)
Sodium: 139 mmol/L (ref 135–145)

## 2014-07-03 SURGERY — DILATATION & CURETTAGE/HYSTEROSCOPY WITH VERSAPOINT RESECTION
Anesthesia: Choice

## 2014-07-03 MED ORDER — ONDANSETRON HCL 4 MG/2ML IJ SOLN
INTRAMUSCULAR | Status: AC
  Filled 2014-07-03: qty 2

## 2014-07-03 MED ORDER — FENTANYL CITRATE 0.05 MG/ML IJ SOLN
INTRAMUSCULAR | Status: AC
  Filled 2014-07-03: qty 5

## 2014-07-03 MED ORDER — FENTANYL CITRATE 0.05 MG/ML IJ SOLN
25.0000 ug | INTRAMUSCULAR | Status: DC | PRN
  Administered 2014-07-03: 50 ug via INTRAVENOUS

## 2014-07-03 MED ORDER — PROPOFOL 10 MG/ML IV BOLUS
INTRAVENOUS | Status: AC
  Filled 2014-07-03: qty 20

## 2014-07-03 MED ORDER — FENTANYL CITRATE 0.05 MG/ML IJ SOLN
INTRAMUSCULAR | Status: DC | PRN
  Administered 2014-07-03: 100 ug via INTRAVENOUS
  Administered 2014-07-03: 50 ug via INTRAVENOUS

## 2014-07-03 MED ORDER — ACETAMINOPHEN 160 MG/5ML PO SOLN
975.0000 mg | Freq: Four times a day (QID) | ORAL | Status: DC | PRN
  Administered 2014-07-03: 975 mg via ORAL

## 2014-07-03 MED ORDER — DEXAMETHASONE SODIUM PHOSPHATE 10 MG/ML IJ SOLN
INTRAMUSCULAR | Status: DC | PRN
  Administered 2014-07-03: 4 mg via INTRAVENOUS

## 2014-07-03 MED ORDER — EPHEDRINE SULFATE 50 MG/ML IJ SOLN
INTRAMUSCULAR | Status: DC | PRN
  Administered 2014-07-03: 10 mg via INTRAVENOUS

## 2014-07-03 MED ORDER — MIDAZOLAM HCL 2 MG/2ML IJ SOLN
INTRAMUSCULAR | Status: DC | PRN
  Administered 2014-07-03: 2 mg via INTRAVENOUS

## 2014-07-03 MED ORDER — CEFAZOLIN SODIUM-DEXTROSE 2-3 GM-% IV SOLR
INTRAVENOUS | Status: AC
  Administered 2014-07-03: 2 g via INTRAVENOUS
  Filled 2014-07-03: qty 50

## 2014-07-03 MED ORDER — ONDANSETRON HCL 4 MG/2ML IJ SOLN
INTRAMUSCULAR | Status: DC | PRN
  Administered 2014-07-03: 4 mg via INTRAVENOUS

## 2014-07-03 MED ORDER — KETOROLAC TROMETHAMINE 30 MG/ML IJ SOLN
INTRAMUSCULAR | Status: AC
  Filled 2014-07-03: qty 1

## 2014-07-03 MED ORDER — SCOPOLAMINE 1 MG/3DAYS TD PT72
1.0000 | MEDICATED_PATCH | Freq: Once | TRANSDERMAL | Status: DC
  Administered 2014-07-03: 1.5 mg via TRANSDERMAL

## 2014-07-03 MED ORDER — DEXAMETHASONE SODIUM PHOSPHATE 4 MG/ML IJ SOLN
INTRAMUSCULAR | Status: AC
  Filled 2014-07-03: qty 1

## 2014-07-03 MED ORDER — EPHEDRINE 5 MG/ML INJ
INTRAVENOUS | Status: AC
Start: 2014-07-03 — End: 2014-07-03
  Filled 2014-07-03: qty 10

## 2014-07-03 MED ORDER — HYDROCODONE-ACETAMINOPHEN 5-325 MG PO TABS
1.0000 | ORAL_TABLET | Freq: Once | ORAL | Status: AC
  Administered 2014-07-03: 1 via ORAL

## 2014-07-03 MED ORDER — HYDROCODONE-IBUPROFEN 7.5-200 MG PO TABS: 1.0000 | ORAL_TABLET | Freq: Three times a day (TID) | ORAL | Status: AC | PRN

## 2014-07-03 MED ORDER — LIDOCAINE HCL (CARDIAC) 20 MG/ML IV SOLN
INTRAVENOUS | Status: DC | PRN
  Administered 2014-07-03: 80 mg via INTRAVENOUS

## 2014-07-03 MED ORDER — BUPIVACAINE HCL (PF) 0.25 % IJ SOLN
INTRAMUSCULAR | Status: DC | PRN
  Administered 2014-07-03: 20 mL

## 2014-07-03 MED ORDER — SCOPOLAMINE 1 MG/3DAYS TD PT72
MEDICATED_PATCH | TRANSDERMAL | Status: AC
  Administered 2014-07-03: 1.5 mg via TRANSDERMAL
  Filled 2014-07-03: qty 1

## 2014-07-03 MED ORDER — SODIUM CHLORIDE 0.9 % IR SOLN
Status: DC | PRN
  Administered 2014-07-03: 3000 mL

## 2014-07-03 MED ORDER — HYDROCODONE-ACETAMINOPHEN 5-325 MG PO TABS
ORAL_TABLET | ORAL | Status: AC
  Filled 2014-07-03: qty 1

## 2014-07-03 MED ORDER — CEFAZOLIN SODIUM-DEXTROSE 2-3 GM-% IV SOLR: 2.0000 g | INTRAVENOUS | Status: DC

## 2014-07-03 MED ORDER — BUPIVACAINE HCL (PF) 0.25 % IJ SOLN
INTRAMUSCULAR | Status: AC
  Filled 2014-07-03: qty 30

## 2014-07-03 MED ORDER — LACTATED RINGERS IV SOLN
INTRAVENOUS | Status: DC
  Administered 2014-07-03 (×2): via INTRAVENOUS

## 2014-07-03 MED ORDER — FENTANYL CITRATE 0.05 MG/ML IJ SOLN
INTRAMUSCULAR | Status: AC
  Filled 2014-07-03: qty 2

## 2014-07-03 MED ORDER — VASOPRESSIN 20 UNIT/ML IV SOLN
INTRAVENOUS | Status: DC | PRN
  Administered 2014-07-03: 20 mL via INTRAMUSCULAR

## 2014-07-03 MED ORDER — LIDOCAINE HCL (CARDIAC) 20 MG/ML IV SOLN
INTRAVENOUS | Status: AC
  Filled 2014-07-03: qty 5

## 2014-07-03 MED ORDER — VASOPRESSIN 20 UNIT/ML IV SOLN
INTRAVENOUS | Status: AC
  Filled 2014-07-03: qty 1

## 2014-07-03 MED ORDER — SODIUM CHLORIDE 0.9 % IJ SOLN
INTRAMUSCULAR | Status: AC
  Filled 2014-07-03: qty 50

## 2014-07-03 MED ORDER — MIDAZOLAM HCL 2 MG/2ML IJ SOLN
INTRAMUSCULAR | Status: AC
  Filled 2014-07-03: qty 2

## 2014-07-03 MED ORDER — PROPOFOL 10 MG/ML IV BOLUS
INTRAVENOUS | Status: DC | PRN
  Administered 2014-07-03: 150 mg via INTRAVENOUS

## 2014-07-03 MED ORDER — ACETAMINOPHEN 160 MG/5ML PO SOLN
ORAL | Status: AC
  Administered 2014-07-03: 975 mg via ORAL
  Filled 2014-07-03: qty 20.3

## 2014-07-03 MED ORDER — KETOROLAC TROMETHAMINE 30 MG/ML IJ SOLN
INTRAMUSCULAR | Status: DC | PRN
  Administered 2014-07-03: 30 mg via INTRAVENOUS

## 2014-07-03 SURGICAL SUPPLY — 16 items
ABLATOR ENDOMETRIAL BIPOLAR (ABLATOR) ×3 IMPLANT
CANISTER SUCT 3000ML (MISCELLANEOUS) ×3 IMPLANT
CATH ROBINSON RED A/P 16FR (CATHETERS) ×3 IMPLANT
CLOTH BEACON ORANGE TIMEOUT ST (SAFETY) ×3 IMPLANT
CONTAINER PREFILL 10% NBF 60ML (FORM) ×6 IMPLANT
ELECTRODE RT ANGLE VERSAPOINT (CUTTING LOOP) ×3 IMPLANT
GLOVE BIO SURGEON STRL SZ7.5 (GLOVE) ×6 IMPLANT
GOWN STRL REUS W/TWL LRG LVL3 (GOWN DISPOSABLE) ×6 IMPLANT
PACK VAGINAL MINOR WOMEN LF (CUSTOM PROCEDURE TRAY) ×3 IMPLANT
PAD OB MATERNITY 4.3X12.25 (PERSONAL CARE ITEMS) ×3 IMPLANT
PAD PREP 24X48 CUFFED NSTRL (MISCELLANEOUS) ×3 IMPLANT
SYR TB 1ML 25GX5/8 (SYRINGE) ×3 IMPLANT
TOWEL OR 17X24 6PK STRL BLUE (TOWEL DISPOSABLE) ×6 IMPLANT
TUBING AQUILEX INFLOW (TUBING) ×3 IMPLANT
TUBING AQUILEX OUTFLOW (TUBING) ×3 IMPLANT
WATER STERILE IRR 1000ML POUR (IV SOLUTION) ×3 IMPLANT

## 2014-07-03 NOTE — Anesthesia Procedure Notes (Signed)
Procedure Name: LMA Insertion Date/Time: 07/03/2014 1:46 PM Performed by: Elgie CongoMALINOVA, Cassadee Vanzandt H Pre-anesthesia Checklist: Patient identified, Emergency Drugs available, Suction available and Patient being monitored Patient Re-evaluated:Patient Re-evaluated prior to inductionOxygen Delivery Method: Circle system utilized Preoxygenation: Pre-oxygenation with 100% oxygen Intubation Type: IV induction Ventilation: Mask ventilation without difficulty LMA: LMA inserted LMA Size: 4.0 Placement Confirmation: positive ETCO2 and breath sounds checked- equal and bilateral Tube secured with: Tape Dental Injury: Teeth and Oropharynx as per pre-operative assessment

## 2014-07-03 NOTE — Op Note (Signed)
07/03/2014  2:27 PM  PATIENT:  Michelle Shields  39 y.o. female  PRE-OPERATIVE DIAGNOSIS: AUB  POST-OPERATIVE DIAGNOSIS:  Multiple endometrial polyps  PROCEDURE:  Procedure(s): DILATATION & CURETTAGE/ HYSTEROSCOPY WITH VERSAPOINT RESECTION NOVASURE ABLATION  SURGEON:  Surgeon(s): Olivia Mackieichard Nykeem Citro, MD  ASSISTANTS: none   ANESTHESIA:   general and paracervical block  ESTIMATED BLOOD LOSS: * No blood loss amount entered *   DRAINS: none   LOCAL MEDICATIONS USED:  MARCAINE    and Amount: 20 ml  SPECIMEN:  Source of Specimen:  EMC, polyp fragments  DISPOSITION OF SPECIMEN:  PATHOLOGY  COUNTS:  YES  DICTATION #: [id number]  PLAN OF CARE: dc home  PATIENT DISPOSITION:  PACU - hemodynamically stable.

## 2014-07-03 NOTE — Transfer of Care (Signed)
Immediate Anesthesia Transfer of Care Note  Patient: Michelle RayaLinda Shields  Procedure(s) Performed: Procedure(s): DILATATION & CURETTAGE/HYSTEROSCOPY WITH VERSAPOINT RESECTION (N/A) NOVASURE ABLATION (N/A)  Patient Location: PACU  Anesthesia Type:General  Level of Consciousness: awake, alert  and oriented  Airway & Oxygen Therapy: Patient Spontanous Breathing  Post-op Assessment: Report given to RN, Post -op Vital signs reviewed and stable and Patient moving all extremities  Post vital signs: Reviewed and stable  Last Vitals: There were no vitals filed for this visit.  Complications: No apparent anesthesia complications

## 2014-07-03 NOTE — Discharge Instructions (Signed)

## 2014-07-03 NOTE — Anesthesia Postprocedure Evaluation (Signed)
  Anesthesia Post-op Note  Patient: Michelle Shields  Procedure(s) Performed: Procedure(s): DILATATION & CURETTAGE/HYSTEROSCOPY WITH VERSAPOINT RESECTION (N/A) NOVASURE ABLATION (N/A) Patient is awake and responsive. Pain and nausea are reasonably well controlled. Vital signs are stable and clinically acceptable. Oxygen saturation is clinically acceptable. There are no apparent anesthetic complications at this time. Patient is ready for discharge.

## 2014-07-03 NOTE — Progress Notes (Signed)
Patient ID: Michelle RayaLinda Furia, female   DOB: July 20, 1975, 39 y.o.   MRN: [id number] Patient seen and examined. Consent witnessed and signed. No changes noted. Update completed.

## 2014-07-03 NOTE — Anesthesia Preprocedure Evaluation (Signed)

## 2014-07-04 ENCOUNTER — Encounter (HOSPITAL_COMMUNITY): Payer: Self-pay | Admitting: Obstetrics and Gynecology

## 2014-07-04 NOTE — Op Note (Signed)
Michelle Shields:  Shields, Michelle              ACCOUNT NO.:  [id number]  MEDICAL RECORD NO.:  [id number]  LOCATION:  [facility]                          [facility]  PHYSICIAN:  Lenoard Adenichard J. Denina Rieger, M.D.DATE OF BIRTH:  1975/06/13  DATE OF PROCEDURE:  07/03/2014 DATE OF DISCHARGE:  07/03/2014                              OPERATIVE REPORT   PREOPERATIVE DIAGNOSIS:  Menometrorrhagia with structural lesion.  POSTOPERATIVE DIAGNOSIS:  Multiple endometrial polyps.  PROCEDURE:  Diagnostic hysteroscopy, D and C, VersaPoint resection of endometrial polyps, NovaSure endometrial ablation.  SURGEON:  Lenoard Adenichard J. Vaanya Shambaugh, M.D.  ASSISTANT:  None.  ANESTHESIA:  General.  ESTIMATED BLOOD LOSS:  Less than 50 mL.  COMPLICATIONS:  None.  FLUID DEFICIT:  150 mL.  COUNTS:  Correct.  DISPOSITION:  The patient to recovery in good condition.  BRIEF OPERATIVE NOTE:  After being apprised of risks of anesthesia, infection, bleeding, injury to surrounding organs, possible need for repair, delayed versus immediate complications to include bowel and bladder injury, possible need for repair, the patient was brought to the operating room where she was administered general anesthetic without complications.  Prepped and draped in usual sterile fashion. Catheterized until the bladder was empty.  Exam under anesthesia reveals a bulky anteflexed uterus and no adnexal masses.  At this time, dilute Pitressin solution placed at 3 and 9 o'clock.  Dilute paracervical block done with Marcaine solution in a standard fashion.  Cervix easily dilated up to a 31 Pratt dilator.  Hysteroscope placed.  Visualization reveals a right lateral wall and 2 large anterior wall endometrial polyps, which were resected using a right angle loop with the VersaPoint resectoscope without difficulty.  At the termination of this resection, the cavity appears normal.  D and C was performed using sharp curettage in a four-quadrant method.   Revisualization revealed a normal endometrial cavity.  NovaSure device was then placed and seated to a length of 4.5, a width of 4.6, and initiated for 77 seconds after a negative CO2 test for an endometrial ablation.  At this time, revisualization reveals the cavity to be well ablated with no evidence of uterine perforation.  Good hemostasis was noted.  Fluid deficit as noted.  All instruments were removed.  The patient tolerated the procedure well.  Awakened and transferred to recovery in good condition.     Lenoard Adenichard J. Finnleigh Marchetti, M.D.     RJT/MEDQ  D:  07/03/2014  T:  07/04/2014  Job:  [id number]  cc:   Lenoard Adenichard J. Lakai Moree, M.D. Fax: [phone]
